# Patient Record
Sex: Female | Born: 1985
Health system: Southern US, Community
[De-identification: ages and names within clinical notes are randomized; demographics above are authoritative.]

---

## 2007-09-27 ENCOUNTER — Emergency Department (HOSPITAL_COMMUNITY): Admission: EM | Admit: 2007-09-27 | Discharge: 2007-09-27 | Payer: Self-pay | Admitting: Emergency Medicine

## 2009-03-10 ENCOUNTER — Emergency Department (HOSPITAL_BASED_OUTPATIENT_CLINIC_OR_DEPARTMENT_OTHER): Admission: EM | Admit: 2009-03-10 | Discharge: 2009-03-10 | Payer: Self-pay | Admitting: Emergency Medicine

## 2009-03-10 ENCOUNTER — Ambulatory Visit: Payer: Self-pay | Admitting: Diagnostic Radiology

## 2011-09-18 LAB — CULTURE, ROUTINE-ABSCESS

## 2016-11-20 ENCOUNTER — Emergency Department (HOSPITAL_BASED_OUTPATIENT_CLINIC_OR_DEPARTMENT_OTHER)
Admission: EM | Admit: 2016-11-20 | Discharge: 2016-11-20 | Disposition: A | Discharge status: Home / Self Care | Payer: Self-pay | Attending: Emergency Medicine | Admitting: Emergency Medicine

## 2016-11-20 ENCOUNTER — Encounter (HOSPITAL_BASED_OUTPATIENT_CLINIC_OR_DEPARTMENT_OTHER): Payer: Self-pay

## 2016-11-20 ENCOUNTER — Emergency Department (HOSPITAL_BASED_OUTPATIENT_CLINIC_OR_DEPARTMENT_OTHER): Payer: Self-pay

## 2016-11-20 DIAGNOSIS — Z79899 Other long term (current) drug therapy: Secondary | ICD-10-CM | POA: Insufficient documentation

## 2016-11-20 DIAGNOSIS — S8002XA Contusion of left knee, initial encounter: Secondary | ICD-10-CM | POA: Insufficient documentation

## 2016-11-20 DIAGNOSIS — T07XXXA Unspecified multiple injuries, initial encounter: Secondary | ICD-10-CM

## 2016-11-20 DIAGNOSIS — S20211A Contusion of right front wall of thorax, initial encounter: Secondary | ICD-10-CM | POA: Insufficient documentation

## 2016-11-20 DIAGNOSIS — Y929 Unspecified place or not applicable: Secondary | ICD-10-CM | POA: Insufficient documentation

## 2016-11-20 DIAGNOSIS — S0083XA Contusion of other part of head, initial encounter: Secondary | ICD-10-CM | POA: Insufficient documentation

## 2016-11-20 DIAGNOSIS — F909 Attention-deficit hyperactivity disorder, unspecified type: Secondary | ICD-10-CM | POA: Insufficient documentation

## 2016-11-20 DIAGNOSIS — S8001XA Contusion of right knee, initial encounter: Secondary | ICD-10-CM | POA: Insufficient documentation

## 2016-11-20 DIAGNOSIS — S40011A Contusion of right shoulder, initial encounter: Secondary | ICD-10-CM | POA: Insufficient documentation

## 2016-11-20 DIAGNOSIS — Y999 Unspecified external cause status: Secondary | ICD-10-CM | POA: Insufficient documentation

## 2016-11-20 DIAGNOSIS — Y9389 Activity, other specified: Secondary | ICD-10-CM | POA: Insufficient documentation

## 2016-11-20 MED ORDER — IBUPROFEN 400 MG PO TABS
400.0000 mg | ORAL_TABLET | Freq: Once | ORAL | Status: AC
  Administered 2016-11-20: 400 mg via ORAL
  Filled 2016-11-20: qty 1

## 2016-11-20 MED ORDER — CYCLOBENZAPRINE HCL 5 MG PO TABS
5.0000 mg | ORAL_TABLET | Freq: Once | ORAL | Status: AC
  Administered 2016-11-20: 5 mg via ORAL
  Filled 2016-11-20: qty 1

## 2016-11-20 MED ORDER — HYDROCODONE-ACETAMINOPHEN 5-325 MG PO TABS
1.0000 | ORAL_TABLET | Freq: Once | ORAL | Status: AC
  Administered 2016-11-20: 1 via ORAL
  Filled 2016-11-20: qty 1

## 2016-11-20 MED ORDER — HYDROCODONE-ACETAMINOPHEN 5-325 MG PO TABS: 1.0000 | ORAL_TABLET | Freq: Four times a day (QID) | ORAL | 0 refills | Status: DC | PRN

## 2016-11-20 MED ORDER — IBUPROFEN 600 MG PO TABS: 600.0000 mg | ORAL_TABLET | Freq: Four times a day (QID) | ORAL | 0 refills | Status: DC | PRN

## 2016-11-20 MED FILL — HYDROCODON-APAP 5-325: 5-325 | 2 days supply | Qty: 8 | Fill #0

## 2016-11-20 MED FILL — IBUPROFEN 600 MG TABLET: 600 | 7 days supply | Qty: 30 | Fill #0
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
## 2016-11-20 NOTE — Discharge Instructions (Signed)
Take motrin for pain.   Take vicodin for severe pain.   Expect bruising for several days. It may get worse before it gets better.   See your doctor.   Apply ice today, heat starting tomorrow.   Return to ER if you have worse pain, vomiting, headaches.
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
## 2021-04-05 ENCOUNTER — Other Ambulatory Visit: Payer: Self-pay

## 2021-04-05 ENCOUNTER — Encounter (INDEPENDENT_AMBULATORY_CARE_PROVIDER_SITE_OTHER): Payer: Self-pay | Admitting: Primary Care

## 2021-04-05 ENCOUNTER — Ambulatory Visit (INDEPENDENT_AMBULATORY_CARE_PROVIDER_SITE_OTHER): Payer: Self-pay | Admitting: Primary Care

## 2021-04-05 VITALS — BP 128/84 | HR 88 | Temp 97.3°F | Wt 136.4 lb

## 2021-04-05 DIAGNOSIS — R569 Unspecified convulsions: Secondary | ICD-10-CM

## 2021-04-05 DIAGNOSIS — E559 Vitamin D deficiency, unspecified: Secondary | ICD-10-CM

## 2021-04-05 DIAGNOSIS — Z7689 Persons encountering health services in other specified circumstances: Secondary | ICD-10-CM

## 2021-04-05 DIAGNOSIS — Z131 Encounter for screening for diabetes mellitus: Secondary | ICD-10-CM

## 2021-04-05 DIAGNOSIS — Z09 Encounter for follow-up examination after completed treatment for conditions other than malignant neoplasm: Secondary | ICD-10-CM

## 2021-04-05 DIAGNOSIS — Z23 Encounter for immunization: Secondary | ICD-10-CM

## 2021-04-05 LAB — POCT GLYCOSYLATED HEMOGLOBIN (HGB A1C): Hemoglobin A1C: 5.3 % (ref 4.0–5.6)

## 2021-04-05 MED ORDER — VITAMIN D3 50 MCG (2000 UT) PO CAPS: 2000.0000 [IU] | ORAL_CAPSULE | Freq: Every day | ORAL | 1 refills | Status: AC

## 2021-04-05 MED ORDER — ERGOCALCIFEROL 1.25 MG (50000 UT) PO CAPS: 50000.0000 [IU] | ORAL_CAPSULE | ORAL | 0 refills | Status: AC

## 2021-04-05 NOTE — Progress Notes (Signed)
New Patient Office Visit  Subjective:  Patient ID: Nichole Hardy, female    DOB: 29-Apr-1986  Age: 35 y.o. MRN: [id number]  CC:  Chief Complaint  Patient presents with  . Hospitalization Follow-up    Contusion of face    HPI Ms. Nichole Hardy is a 35 year old female who is establishing care.  Hospital follow up from [facility] - she present on ED on 03/03/21 with complaints of multiple seizures. Reports seizure lasted 5 or longer minutes. Uncertain the length of time of the 3rd seizures. Seizures are associated photophobia, nausea, and vomiting.She has had follow up with neurology . History reviewed. No pertinent past medical history.  History reviewed. No pertinent surgical history.  History reviewed. No pertinent family history.  Social History   Socioeconomic History  . Marital status: Single    Spouse name: Not on file  . Number of children: Not on file  . Years of education: Not on file  . Highest education level: Not on file  Occupational History  . Not on file  Tobacco Use  . Smoking status: Never Smoker  . Smokeless tobacco: Never Used  Substance and Sexual Activity  . Alcohol use: Yes    Comment: occ  . Drug use: No  . Sexual activity: Not on file  Other Topics Concern  . Not on file  Social History Narrative  . Not on file   Social Determinants of Health   Financial Resource Strain: Not on file  Food Insecurity: Not on file  Transportation Needs: Not on file  Physical Activity: Not on file  Stress: Not on file  Social Connections: Not on file  Intimate Partner Violence: Not on file    ROS Review of Systems  Constitutional: Positive for fatigue.  Neurological: Positive for seizures.  All other systems reviewed and are negative.   Objective:   Today's Vitals: BP 128/84 (BP Location: Right Arm, Patient Position: Sitting, Cuff Size: Normal)   Pulse 88   Temp (!) 97.3 F (36.3 C) (Temporal)   Wt 136 lb 6.4 oz (61.9 kg)   LMP  04/04/2021 (Exact Date)   SpO2 96%   BMI 24.95 kg/m   Physical Exam Vitals reviewed.  HENT:     Head: Normocephalic.     Right Ear: Tympanic membrane and external ear normal.     Left Ear: Tympanic membrane and external ear normal.     Nose: Nose normal.  Eyes:     Extraocular Movements: Extraocular movements intact.     Pupils: Pupils are equal, round, and reactive to light.  Cardiovascular:     Rate and Rhythm: Normal rate and regular rhythm.  Pulmonary:     Effort: Pulmonary effort is normal.     Breath sounds: Normal breath sounds.  Musculoskeletal:        General: Normal range of motion.     Cervical back: Normal range of motion.  Skin:    General: Skin is warm and dry.  Neurological:     Mental Status: She is alert and oriented to person, place, and time.     Assessment & Plan:  Nichole Hardy was seen today for hospitalization follow-up.  Diagnoses and all orders for this visit:  Screening for diabetes mellitus -     HgB A1c 5.3 Per ADA guidelines she is not an Diabetic   Need for Tdap vaccination -     Tdap vaccine greater than or equal to 7yo IM  Vitamin  D deficiency Reviewing labs indicated vitamin D deficiency  -     ergocalciferol (VITAMIN D2) 1.25 MG (50000 UT) capsule; Take 1 capsule (50,000 Units total) by mouth once a week. -     Cholecalciferol (VITAMIN D3) 50 MCG (2000 UT) capsule; Take 1 capsule (2,000 Units total) by mouth daily. Do not start until completion of vitamin D2  Encounter to establish care Establish care   Seizures [facility]) Discharge with Keppra 250mg  bid f/u and xanax 1mg  BID prn .Neurologist may make changes   Hospital discharge follow-up Recommend to establish care with PCP and follow schedule appt with neurologist.     Follow-up: Return in about 4 months (around 08/05/2021) for Pap .   , NP

## 2021-04-05 NOTE — Patient Instructions (Signed)
Vitamin D Deficiency Vitamin D deficiency is when your body does not have enough vitamin D. Vitamin D is important to your body because:  It helps your body use other minerals.  It helps to keep your bones strong and healthy.  It may help to prevent some diseases.  It helps your heart and other muscles work well. Not getting enough vitamin D can make your bones soft. It can also cause other health problems. What are the causes? This condition may be caused by:  Not eating enough foods that contain vitamin D.  Not getting enough sun.  Having diseases that make it hard for your body to absorb vitamin D.  Having a surgery in which a part of the stomach or a part of the small intestine is removed.  Having kidney disease or liver disease. What increases the risk? You are more likely to get this condition if:  You are older.  You do not spend much time outdoors.  You live in a nursing home.  You have had broken bones.  You have weak or thin bones (osteoporosis).  You have a disease or condition that changes how your body absorbs vitamin D.  You have dark skin.  You take certain medicines.  You are overweight or obese. What are the signs or symptoms?  In mild cases, there may not be any symptoms. If the condition is very bad, symptoms may include: ? Bone pain. ? Muscle pain. ? Falling often. ? Broken bones caused by a minor injury. How is this treated? Treatment may include taking supplements as told by your doctor. Your doctor will tell you what dose is best for you. Supplements may include:  Vitamin D.  Calcium. Follow these instructions at home: Eating and drinking  Eat foods that contain vitamin D, such as: ? Dairy products, cereals, or juices with added vitamin D. Check the label. ? Fish, such as salmon or trout. ? Eggs. ? Oysters. ? Mushrooms. The items listed above may not be a complete list of what you can eat and drink. Contact a dietitian for more  options.   General instructions  Take medicines and supplements only as told by your doctor.  Get regular, safe exposure to natural sunlight.  Do not use a tanning bed.  Maintain a healthy weight. Lose weight if needed.  Keep all follow-up visits as told by your doctor. This is important. How is this prevented?  You can get vitamin D by: ? Eating foods that naturally contain vitamin D. ? Eating or drinking products that have vitamin D added to them, such as cereals, juices, and milk. ? Taking vitamin D or a multivitamin that contains vitamin D. ? Being in the sun. Your body makes vitamin D when your skin is exposed to sunlight. Your body changes the sunlight into a form of the vitamin that it can use. Contact a doctor if:  Your symptoms do not go away.  You feel sick to your stomach (nauseous).  You throw up (vomit).  You poop less often than normal, or you have trouble pooping (constipation). Summary  Vitamin D deficiency is when your body does not have enough vitamin D.  Vitamin D helps to keep your bones strong and healthy.  This condition is often treated by taking a supplement.  Your doctor will tell you what dose is best for you. This information is not intended to replace advice given to you by your health care provider. Make sure you discuss any questions   you have with your health care provider. Document Revised: 08/03/2018 Document Reviewed: 08/03/2018 Elsevier Patient Education  2021 [employer]  

## 2021-08-06 ENCOUNTER — Ambulatory Visit (INDEPENDENT_AMBULATORY_CARE_PROVIDER_SITE_OTHER): Payer: Self-pay | Admitting: Primary Care

## 2024-09-03 DIAGNOSIS — F321 Major depressive disorder, single episode, moderate: Secondary | ICD-10-CM | POA: Diagnosis not present

## 2024-09-03 DIAGNOSIS — F4311 Post-traumatic stress disorder, acute: Secondary | ICD-10-CM | POA: Diagnosis not present

## 2024-09-15 DIAGNOSIS — R569 Unspecified convulsions: Secondary | ICD-10-CM | POA: Diagnosis not present

## 2024-09-15 DIAGNOSIS — G43109 Migraine with aura, not intractable, without status migrainosus: Secondary | ICD-10-CM | POA: Diagnosis not present

## 2024-09-15 DIAGNOSIS — R42 Dizziness and giddiness: Secondary | ICD-10-CM | POA: Diagnosis not present

## 2024-09-17 DIAGNOSIS — F321 Major depressive disorder, single episode, moderate: Secondary | ICD-10-CM | POA: Diagnosis not present

## 2024-09-17 DIAGNOSIS — F4311 Post-traumatic stress disorder, acute: Secondary | ICD-10-CM | POA: Diagnosis not present

## 2024-10-07 DIAGNOSIS — F4311 Post-traumatic stress disorder, acute: Secondary | ICD-10-CM | POA: Diagnosis not present

## 2024-10-07 DIAGNOSIS — F321 Major depressive disorder, single episode, moderate: Secondary | ICD-10-CM | POA: Diagnosis not present
